# Patient Record
Sex: Male | Born: 1990 | Race: Black or African American | Hispanic: No | Marital: Single | Smoking: Never smoker
Health system: Southern US, Community
[De-identification: ages and names within clinical notes are randomized; demographics above are authoritative.]

## PROBLEM LIST (undated history)

## (undated) DIAGNOSIS — J45909 Unspecified asthma, uncomplicated: Secondary | ICD-10-CM

---

## 2016-11-11 ENCOUNTER — Ambulatory Visit (HOSPITAL_COMMUNITY)
Admission: EM | Admit: 2016-11-11 | Discharge: 2016-11-11 | Disposition: A | Discharge status: Home / Self Care | Payer: BLUE CROSS/BLUE SHIELD | Attending: Family Medicine | Admitting: Family Medicine

## 2016-11-11 ENCOUNTER — Encounter (HOSPITAL_COMMUNITY): Payer: Self-pay | Admitting: Emergency Medicine

## 2016-11-11 DIAGNOSIS — Z202 Contact with and (suspected) exposure to infections with a predominantly sexual mode of transmission: Secondary | ICD-10-CM | POA: Insufficient documentation

## 2016-11-11 HISTORY — DX: Unspecified asthma, uncomplicated: J45.909

## 2016-11-11 LAB — POCT URINALYSIS DIP (DEVICE)
BILIRUBIN URINE: NEGATIVE
Glucose, UA: NEGATIVE mg/dL
HGB URINE DIPSTICK: NEGATIVE
KETONES UR: NEGATIVE mg/dL
Nitrite: NEGATIVE
PH: 7.5 (ref 5.0–8.0)
Protein, ur: 30 mg/dL — AB
SPECIFIC GRAVITY, URINE: 1.015 (ref 1.005–1.030)
Urobilinogen, UA: 1 mg/dL (ref 0.0–1.0)

## 2016-11-11 MED ORDER — CEFTRIAXONE SODIUM 250 MG IJ SOLR
250.0000 mg | Freq: Once | INTRAMUSCULAR | Status: AC
  Administered 2016-11-11: 250 mg via INTRAMUSCULAR

## 2016-11-11 MED ORDER — AZITHROMYCIN 250 MG PO TABS
1000.0000 mg | ORAL_TABLET | Freq: Once | ORAL | Status: AC
  Administered 2016-11-11: 1000 mg via ORAL

## 2016-11-11 MED ORDER — AZITHROMYCIN 250 MG PO TABS
ORAL_TABLET | ORAL | Status: AC
  Filled 2016-11-11: qty 4

## 2016-11-11 MED ORDER — CEFTRIAXONE SODIUM 250 MG IJ SOLR
INTRAMUSCULAR | Status: AC
  Filled 2016-11-11: qty 250

## 2016-11-11 MED ORDER — LIDOCAINE HCL (PF) 1 % IJ SOLN
INTRAMUSCULAR | Status: AC
  Filled 2016-11-11: qty 2

## 2016-11-11 NOTE — ED Triage Notes (Signed)
Pt was exposed to Chlamydia.  He is not currently having any symptoms.

## 2016-11-11 NOTE — Discharge Instructions (Signed)
You're being tested today for gonorrhea, and chlamydia. Also you are also being treated as well with 1 g of azithromycin, and 250 mg of ceftriaxone. He will be notified of the results within 3-5 business days, and should any additional treatment, or therapy be needed he will be contacted and informed. I recommend returning to clinic, or at the health department in 7 days for rescreening, to insure your ear infection has cleared

## 2016-11-11 NOTE — ED Provider Notes (Signed)
CSN: [id number]     Arrival date & time 11/11/16  1632 History   First MD Initiated Contact with Patient 11/11/16 1704     Chief Complaint  Patient presents with  . Exposure to STD   (Consider location/radiation/quality/duration/timing/severity/associated sxs/prior Treatment) 26 year old male patient presents to clinic for STD screening and treatment. He reports his girlfriend has been diagnosed with chlamydia however he denies symptoms. He denies any penile discharge, denies dysuria, denies flank pain, fever, chills, abdominal pain, or other symptoms. He is sexually active, and does not use protection   The history is provided by the patient.  Exposure to STD     Past Medical History:  Diagnosis Date  . Asthma    History reviewed. No pertinent surgical history. History reviewed. No pertinent family history. Social History  Substance Use Topics  . Smoking status: Never Smoker  . Smokeless tobacco: Never Used  . Alcohol use Yes    Review of Systems  Reason unable to perform ROS: as covered in HPI.  All other systems reviewed and are negative.   Allergies  Patient has no known allergies.  Home Medications   Prior to Admission medications   Not on File   Meds Ordered and Administered this Visit   Medications  azithromycin (ZITHROMAX) tablet 1,000 mg (1,000 mg Oral Given 11/11/16 1718)  cefTRIAXone (ROCEPHIN) injection 250 mg (250 mg Intramuscular Given 11/11/16 1719)    BP 141/93 (BP Location: Left Arm)   Pulse 68   Temp 99.1 F (37.3 C) (Oral)   SpO2 99%  No data found.   Physical Exam  Constitutional: He is oriented to person, place, and time. He appears well-developed and well-nourished. No distress.  HENT:  Head: Normocephalic and atraumatic.  Abdominal: Soft. Bowel sounds are normal. He exhibits no distension. There is no tenderness. There is no guarding.  Genitourinary:  Genitourinary Comments: Deferred, urine cytology obtained.  Neurological: He is  alert and oriented to person, place, and time.  Skin: Skin is warm and dry. Capillary refill takes less than 2 seconds. He is not diaphoretic.  Psychiatric: He has a normal mood and affect.  Nursing note and vitals reviewed.   Urgent Care Course     Procedures (including critical care time)  Labs Review Labs Reviewed  POCT URINALYSIS DIP (DEVICE) - Abnormal; Notable for the following:       Result Value   Protein, ur 30 (*)    Leukocytes, UA TRACE (*)    All other components within normal limits  URINE CYTOLOGY ANCILLARY ONLY    Imaging Review No results found.      MDM   1. STD exposure    You're being tested today for gonorrhea, and chlamydia. Also you are also being treated as well with 1 g of azithromycin, and 250 mg of ceftriaxone. He will be notified of the results within 3-5 business days, and should any additional treatment, or therapy be needed he will be contacted and informed. I recommend returning to clinic, or at the health department in 7 days for rescreening, to insure your ear infection has cleared     Dorena BodoLawrence Polette Nofsinger, NP 11/11/16 1745

## 2016-11-13 LAB — URINE CYTOLOGY ANCILLARY ONLY
Chlamydia: NEGATIVE
NEISSERIA GONORRHEA: NEGATIVE

## 2016-12-12 ENCOUNTER — Encounter (HOSPITAL_COMMUNITY): Payer: Self-pay | Admitting: *Deleted

## 2016-12-12 ENCOUNTER — Ambulatory Visit (HOSPITAL_COMMUNITY)
Admission: EM | Admit: 2016-12-12 | Discharge: 2016-12-12 | Disposition: A | Discharge status: Home / Self Care | Payer: BLUE CROSS/BLUE SHIELD | Attending: Family Medicine | Admitting: Family Medicine

## 2016-12-12 DIAGNOSIS — J45901 Unspecified asthma with (acute) exacerbation: Secondary | ICD-10-CM | POA: Diagnosis not present

## 2016-12-12 MED ORDER — ALBUTEROL SULFATE (2.5 MG/3ML) 0.083% IN NEBU
2.5000 mg | INHALATION_SOLUTION | Freq: Once | RESPIRATORY_TRACT | Status: AC
  Administered 2016-12-12: 2.5 mg via RESPIRATORY_TRACT

## 2016-12-12 MED ORDER — PREDNISONE 10 MG PO TABS: 20.0000 mg | ORAL_TABLET | Freq: Every day | ORAL | 0 refills | Status: AC

## 2016-12-12 MED ORDER — IPRATROPIUM-ALBUTEROL 0.5-2.5 (3) MG/3ML IN SOLN
RESPIRATORY_TRACT | Status: AC
  Filled 2016-12-12: qty 3

## 2016-12-12 MED ORDER — ALBUTEROL SULFATE HFA 108 (90 BASE) MCG/ACT IN AERS: 1.0000 | INHALATION_SPRAY | Freq: Four times a day (QID) | RESPIRATORY_TRACT | 0 refills | Status: AC | PRN

## 2016-12-12 MED ORDER — BENZONATATE 100 MG PO CAPS: 100.0000 mg | ORAL_CAPSULE | Freq: Three times a day (TID) | ORAL | 0 refills | Status: AC

## 2016-12-12 MED ORDER — ALBUTEROL SULFATE (2.5 MG/3ML) 0.083% IN NEBU
INHALATION_SOLUTION | RESPIRATORY_TRACT | Status: AC
  Filled 2016-12-12: qty 3

## 2016-12-12 NOTE — ED Triage Notes (Signed)
Pt  Reports      Cough  And  Congestion     With   Headache      X   Several  Days        Pt    Reports  A  Headache     And    Stuffy  Nose   As   Well     Symptoms      Pt   Has  A  History of  Asthma  In past

## 2016-12-12 NOTE — ED Provider Notes (Signed)
CSN: [id number]     Arrival date & time 12/12/16  1657 History   First MD Initiated Contact with Patient 12/12/16 1719     Chief Complaint  Patient presents with  . Cough   (Consider location/radiation/quality/duration/timing/severity/associated sxs/prior Treatment) The history is provided by the patient.  Cough  Cough characteristics:  Non-productive Onset quality:  Sudden Duration:  1 day Timing:  Constant Progression:  Worsening Smoker: no (smokes Marijuana)   Relieved by:  Nothing Associated symptoms: shortness of breath (Chest tightness)   Associated symptoms: no chills, no fever and no wheezing    : 26 y/o male with H/O asthma presented to Emergency room with CC of non productive cough x 1 days, gradually worsening.Reports chest tightness and SOB with deep coughing. Unable to sleep due to persistent cough. Denies fever/chills.   Past Medical History:  Diagnosis Date  . Asthma    History reviewed. No pertinent surgical history. History reviewed. No pertinent family history. Social History  Substance Use Topics  . Smoking status: Never Smoker  . Smokeless tobacco: Never Used  . Alcohol use Yes    Review of Systems  Constitutional: Negative.  Negative for chills and fever.  HENT: Negative for congestion.   Respiratory: Positive for cough and shortness of breath (Chest tightness). Negative for wheezing.     Allergies  Patient has no known allergies.  Home Medications   Prior to Admission medications   Medication Sig Start Date End Date Taking? Authorizing Provider  albuterol (PROVENTIL HFA;VENTOLIN HFA) 108 (90 Base) MCG/ACT inhaler Inhale 1-2 puffs into the lungs every 6 (six) hours as needed for wheezing or shortness of breath. 12/12/16   Kellin Fifer, NP  benzonatate (TESSALON) 100 MG capsule Take 1 capsule (100 mg total) by mouth every 8 (eight) hours. 12/12/16   Raequon Catanzaro, NP  predniSONE (DELTASONE) 10 MG tablet Take 2 tablets (20 mg total) by mouth  daily with breakfast. 12/12/16   Simone Tuckey, NP   Meds Ordered and Administered this Visit   Medications  albuterol (PROVENTIL) (2.5 MG/3ML) 0.083% nebulizer solution 2.5 mg (2.5 mg Nebulization Given 12/12/16 1744)    BP 130/72 (BP Location: Right Arm)   Pulse 80   Temp 98.6 F (37 C) (Oral)   Resp 18   SpO2 100%  No data found.   Physical Exam  Constitutional: He is oriented to person, place, and time. He appears well-developed and well-nourished. No distress.  HENT:  Head: Normocephalic.  Eyes: Pupils are equal, round, and reactive to light.  Neck: Neck supple.  Cardiovascular: Normal rate, regular rhythm and normal heart sounds.   Pulmonary/Chest: Effort normal. No respiratory distress. He has wheezes (Bibasilar expiratory wheezing. No air flow limitation appreciated ).  Neurological: He is alert and oriented to person, place, and time.  Skin: Skin is warm.    Urgent Care Course     Procedures (including critical care time)  Labs Review Labs Reviewed - No data to display  Imaging Review No results found.   Visual Acuity Review  Right Eye Distance:   Left Eye Distance:   Bilateral Distance:    Right Eye Near:   Left Eye Near:    Bilateral Near:         MDM   1. Mild asthma with acute exacerbation, unspecified whether persistent   Use all medications as prescribed. If Sx worsens return to clinic or go to ED     Veronique Warga Nicky PughMultani, NP 12/12/16 1807

## 2016-12-12 NOTE — Discharge Instructions (Signed)
Advised to use Inhaler 1-2 puffs as needed Use all prescribed medications as directed. If Sx worsens return to clinic for re evaluation or go to ED

## 2017-05-06 ENCOUNTER — Emergency Department (HOSPITAL_COMMUNITY)
Admission: EM | Admit: 2017-05-06 | Discharge: 2017-05-06 | Disposition: A | Discharge status: Home / Self Care | Payer: BLUE CROSS/BLUE SHIELD | Attending: Emergency Medicine | Admitting: Emergency Medicine

## 2017-05-06 ENCOUNTER — Encounter (HOSPITAL_COMMUNITY): Payer: Self-pay | Admitting: *Deleted

## 2017-05-06 DIAGNOSIS — Y9389 Activity, other specified: Secondary | ICD-10-CM | POA: Insufficient documentation

## 2017-05-06 DIAGNOSIS — Y998 Other external cause status: Secondary | ICD-10-CM | POA: Insufficient documentation

## 2017-05-06 DIAGNOSIS — S61431A Puncture wound without foreign body of right hand, initial encounter: Secondary | ICD-10-CM | POA: Insufficient documentation

## 2017-05-06 DIAGNOSIS — J45909 Unspecified asthma, uncomplicated: Secondary | ICD-10-CM | POA: Insufficient documentation

## 2017-05-06 DIAGNOSIS — Y92017 Garden or yard in single-family (private) house as the place of occurrence of the external cause: Secondary | ICD-10-CM | POA: Insufficient documentation

## 2017-05-06 DIAGNOSIS — Z23 Encounter for immunization: Secondary | ICD-10-CM | POA: Insufficient documentation

## 2017-05-06 DIAGNOSIS — S61432A Puncture wound without foreign body of left hand, initial encounter: Secondary | ICD-10-CM | POA: Insufficient documentation

## 2017-05-06 DIAGNOSIS — W540XXA Bitten by dog, initial encounter: Secondary | ICD-10-CM | POA: Insufficient documentation

## 2017-05-06 DIAGNOSIS — Z79899 Other long term (current) drug therapy: Secondary | ICD-10-CM | POA: Insufficient documentation

## 2017-05-06 DIAGNOSIS — Z203 Contact with and (suspected) exposure to rabies: Secondary | ICD-10-CM | POA: Insufficient documentation

## 2017-05-06 MED ORDER — RABIES IMMUNE GLOBULIN 150 UNIT/ML IM INJ
20.0000 [IU]/kg | INJECTION | Freq: Once | INTRAMUSCULAR | Status: AC
  Administered 2017-05-06: 1125 [IU]
  Filled 2017-05-06: qty 8

## 2017-05-06 MED ORDER — TETANUS-DIPHTH-ACELL PERTUSSIS 5-2.5-18.5 LF-MCG/0.5 IM SUSP
0.5000 mL | Freq: Once | INTRAMUSCULAR | Status: AC
  Administered 2017-05-06: 0.5 mL via INTRAMUSCULAR
  Filled 2017-05-06: qty 0.5

## 2017-05-06 MED ORDER — OXYCODONE-ACETAMINOPHEN 5-325 MG PO TABS
1.0000 | ORAL_TABLET | Freq: Once | ORAL | Status: AC
  Administered 2017-05-06: 1 via ORAL
  Filled 2017-05-06: qty 1

## 2017-05-06 MED ORDER — IBUPROFEN 400 MG PO TABS
600.0000 mg | ORAL_TABLET | Freq: Once | ORAL | Status: AC
  Administered 2017-05-06: 14:00:00 600 mg via ORAL
  Filled 2017-05-06: qty 1

## 2017-05-06 MED ORDER — AMOXICILLIN-POT CLAVULANATE 875-125 MG PO TABS: 1.0000 | ORAL_TABLET | Freq: Two times a day (BID) | ORAL | 0 refills | Status: DC

## 2017-05-06 MED ORDER — AMOXICILLIN-POT CLAVULANATE 875-125 MG PO TABS
1.0000 | ORAL_TABLET | Freq: Once | ORAL | Status: AC
  Administered 2017-05-06: 1 via ORAL
  Filled 2017-05-06: qty 1

## 2017-05-06 MED ORDER — RABIES VACCINE, PCEC IM SUSR
1.0000 mL | Freq: Once | INTRAMUSCULAR | Status: AC
  Administered 2017-05-06: 1 mL via INTRAMUSCULAR
  Filled 2017-05-06: qty 1

## 2017-05-06 NOTE — ED Provider Notes (Signed)
[facility]-EMERGENCY DEPT Provider Note   CSN: [id number] Arrival date & time: 05/06/17  1132   By signing my name below, I, Douglas Lozano, attest that this documentation has been prepared under the direction and in the presence of Douglas Razor, MD. Electronically signed, Douglas Lozano, ED Scribe. 05/06/17. 1:29 PM.   History   Chief Complaint Chief Complaint  Patient presents with  . Animal Bite   The history is provided by the patient and medical records. No language interpreter was used.    Douglas Lozano is a 26 y.o. male presenting to the Emergency Department concerning puncture wounds from dog bites on bilateral hands that he sustained last night. Bleeding controlled on exam with sterile dressing. Pt describes 10/10, constant pain worse with contact and certain hand movements. Pt states an unrecognized dog got into his back yard and began fighting his dog, biting him when he attempted to break up the fight. Pt unable to locate the dog, unsure if or by whom the dog is owned and unsure of its vaccination history. Last tetanus < 10 years ago. No other complaints at this time.   Past Medical History:  Diagnosis Date  . Asthma     There are no active problems to display for this patient.   History reviewed. No pertinent surgical history.     Home Medications    Prior to Admission medications   Medication Sig Start Date End Date Taking? Authorizing Provider  albuterol (PROVENTIL HFA;VENTOLIN HFA) 108 (90 Base) MCG/ACT inhaler Inhale 1-2 puffs into the lungs every 6 (six) hours as needed for wheezing or shortness of breath. 12/12/16   Multani, Bhupinder, NP  benzonatate (TESSALON) 100 MG capsule Take 1 capsule (100 mg total) by mouth every 8 (eight) hours. 12/12/16   Multani, Bhupinder, NP  predniSONE (DELTASONE) 10 MG tablet Take 2 tablets (20 mg total) by mouth daily with breakfast. 12/12/16   Multani, Bhupinder, NP    Family History No family history on file.  Social  History Social History  Substance Use Topics  . Smoking status: Never Smoker  . Smokeless tobacco: Never Used  . Alcohol use Yes     Allergies   Patient has no known allergies.   Review of Systems Review of Systems  Constitutional: Negative for fever.  Respiratory: Negative for chest tightness and shortness of breath.   Cardiovascular: Negative for chest pain.  Gastrointestinal: Negative for nausea and vomiting.  Musculoskeletal: Negative for neck pain and neck stiffness.  Skin: Positive for wound. Negative for color change.  Neurological: Negative for weakness and numbness.  All other systems reviewed and are negative.    Physical Exam Updated Vital Signs BP (!) 136/94   Pulse 64   Temp 98.7 F (37.1 C) (Oral)   Resp 18   Wt 125 lb (56.7 kg)   SpO2 100%   Physical Exam  Constitutional: He is oriented to person, place, and time. He appears well-developed and well-nourished.  HENT:  Head: Normocephalic and atraumatic.  Eyes: EOM are normal.  Neck: Normal range of motion.  Cardiovascular: Normal rate, regular rhythm, normal heart sounds and intact distal pulses.   Pulmonary/Chest: Effort normal and breath sounds normal. No respiratory distress.  Abdominal: Soft. He exhibits no distension. There is no tenderness.  Musculoskeletal: Normal range of motion.  Neurological: He is alert and oriented to person, place, and time.  Skin: Skin is warm and dry.  2 puncture wounds over dorsal aspect of distal R wrist, 1 to palmar aspect  of R hand, 1 to fingertip of L 4th digit. No drainage or redness.  Psychiatric: He has a normal mood and affect. Judgment normal.  Nursing note and vitals reviewed.    ED Treatments / Results  DIAGNOSTIC STUDIES: Oxygen Saturation is 100% on RA, NL by my interpretation.    COORDINATION OF CARE: 1:20 PM-Discussed next steps with pt. Pt verbalized understanding and is agreeable with the plan. Will order medications.   Labs (all labs  ordered are listed, but only abnormal results are displayed) Labs Reviewed - No data to display  EKG  EKG Interpretation None       Radiology No results found.  Procedures Procedures (including critical care time)  Medications Ordered in ED Medications - No data to display   Initial Impression / Assessment and Plan / ED Course  I have reviewed the triage vital signs and the nursing notes.  Pertinent labs & imaging results that were available during my care of the patient were reviewed by me and considered in my medical decision making (see chart for details).     25yM with dog bites to hands. No over signs of infection. Local wound care. Abx. Update tetanus. PRN NSAIDs. Unknown dog. Rabies PEP.   Final Clinical Impressions(s) / ED Diagnoses   Final diagnoses:  Dog bite, initial encounter  Need for prophylactic vaccination and inoculation against rabies    New Prescriptions New Prescriptions   No medications on file    I personally preformed the services scribed in my presence. The recorded information has been reviewed is accurate. Douglas Razor, MD.    Douglas Razor, MD 05/06/17 [phone]

## 2017-05-06 NOTE — ED Notes (Signed)
Pt verbalized understanding discharge instructions and denies any further needs or questions at this time. VS stable, ambulatory and steady gait.   

## 2017-05-06 NOTE — ED Triage Notes (Addendum)
Pt was bitten by an unknown dog in [place] (dog came in his back yard last pm). Pt has a puncture to his right hand, right wrist and left ring finger.  Pt does not know when his last tetanus shot was and he does not know the dog that bit him (and thus not the vaccination status of that dog).

## 2017-05-06 NOTE — Discharge Instructions (Signed)
The CDC recommends rabies post exposure prophylaxis (vaccine) on days 1, 3, 7 and 14. You're getting the first dose today. You need repeat vaccination on 8/21, 8/26 and 05/20/17.   You can go to the [facility] during the day to get these. The wait is generally shorter. Return to the ER if you have to. Take antibiotics until finished.  Clean wounds with mild soap and warm water. Take ibuprofen 600mg  every 6 hours as needed for pain.

## 2017-05-09 ENCOUNTER — Emergency Department (HOSPITAL_COMMUNITY)
Admission: EM | Admit: 2017-05-09 | Discharge: 2017-05-10 | Disposition: A | Discharge status: Home / Self Care | Payer: BLUE CROSS/BLUE SHIELD | Attending: Emergency Medicine | Admitting: Emergency Medicine

## 2017-05-09 ENCOUNTER — Encounter (HOSPITAL_COMMUNITY): Payer: Self-pay

## 2017-05-09 DIAGNOSIS — Z23 Encounter for immunization: Secondary | ICD-10-CM | POA: Insufficient documentation

## 2017-05-09 DIAGNOSIS — W540XXD Bitten by dog, subsequent encounter: Secondary | ICD-10-CM | POA: Insufficient documentation

## 2017-05-09 DIAGNOSIS — Z79899 Other long term (current) drug therapy: Secondary | ICD-10-CM | POA: Insufficient documentation

## 2017-05-09 DIAGNOSIS — J45909 Unspecified asthma, uncomplicated: Secondary | ICD-10-CM | POA: Insufficient documentation

## 2017-05-09 MED ORDER — RABIES VACCINE, PCEC IM SUSR
1.0000 mL | Freq: Once | INTRAMUSCULAR | Status: AC
  Administered 2017-05-09: 1 mL via INTRAMUSCULAR
  Filled 2017-05-09: qty 1

## 2017-05-09 NOTE — ED Notes (Signed)
Waiting for pharmacy to send medication

## 2017-05-09 NOTE — ED Provider Notes (Signed)
[facility] Provider Note   CSN: [id number] Arrival date & time: 05/09/17  2111     History   Chief Complaint Chief Complaint  Patient presents with  . Rabies Injection    HPI Douglas Lozano is a 26 y.o. male who presents to ED for second dose of rabies vaccine after dog bite sustained on 05/06/17.  Unknown dog bit right distal forearm/wrist and left fourth finger on 8/19. First dose of rabies vaccine on 8/19. Was prescribed augmentin but states he has not refilled it because he doesn't get paid until 05/11/17 and has no other way to pay for medication. Has been using water, soap and peroxide to keep clean. Denies pain, swelling, redness, drainage, fevers, pain with movement.   HPI  Past Medical History:  Diagnosis Date  . Asthma     There are no active problems to display for this patient.   History reviewed. No pertinent surgical history.     Home Medications    Prior to Admission medications   Medication Sig Start Date End Date Taking? Authorizing Provider  albuterol (PROVENTIL HFA;VENTOLIN HFA) 108 (90 Base) MCG/ACT inhaler Inhale 1-2 puffs into the lungs every 6 (six) hours as needed for wheezing or shortness of breath. 12/12/16   Multani, Bhupinder, NP  amoxicillin-clavulanate (AUGMENTIN) 875-125 MG tablet Take 1 tablet by mouth 2 (two) times daily. 05/06/17   Raeford Razor, MD  benzonatate (TESSALON) 100 MG capsule Take 1 capsule (100 mg total) by mouth every 8 (eight) hours. 12/12/16   Multani, Bhupinder, NP  predniSONE (DELTASONE) 10 MG tablet Take 2 tablets (20 mg total) by mouth daily with breakfast. 12/12/16   Multani, Bhupinder, NP    Family History No family history on file.  Social History Social History  Substance Use Topics  . Smoking status: Never Smoker  . Smokeless tobacco: Never Used  . Alcohol use Yes     Allergies   Patient has no known allergies.   Review of Systems Review of Systems  Constitutional: Negative for fever.    Musculoskeletal: Negative for arthralgias.  Skin: Positive for wound. Negative for color change.  Allergic/Immunologic: Negative for immunocompromised state.     Physical Exam Updated Vital Signs BP (!) 145/93 (BP Location: Left Arm)   Pulse 63   Temp 98.2 F (36.8 C) (Oral)   Resp 16   Wt 56.7 kg (125 lb)   SpO2 100%   Physical Exam  Constitutional: He is oriented to person, place, and time. He appears well-developed and well-nourished. No distress.  NAD.  HENT:  Head: Normocephalic and atraumatic.  Right Ear: External ear normal.  Left Ear: External ear normal.  Nose: Nose normal.  Eyes: Conjunctivae and EOM are normal. No scleral icterus.  Neck: Normal range of motion. Neck supple.  Cardiovascular: Normal rate, regular rhythm, normal heart sounds and intact distal pulses.   No murmur heard. <2 cap refill to bilateral digit tips   Pulmonary/Chest: Effort normal and breath sounds normal. He has no wheezes.  Musculoskeletal: Normal range of motion. He exhibits no deformity.  Full painless AROM of bilateral wrists and digits  Full thumb opposition bilaterally   Neurological: He is alert and oriented to person, place, and time.  Skin: Skin is warm and dry. Capillary refill takes less than 2 seconds.  1 puncture wound to distal dorsal forearm 1 puncture wound to hypothenar prominence  Superficial abrasion to left 4th digit finger pad  Psychiatric: He has a normal mood and affect. His behavior  is normal. Judgment and thought content normal.  Nursing note and vitals reviewed.    ED Treatments / Results  Labs (all labs ordered are listed, but only abnormal results are displayed) Labs Reviewed - No data to display  EKG  EKG Interpretation None       Radiology No results found.  Procedures Procedures (including critical care time)  Medications Ordered in ED Medications  rabies vaccine (RABAVERT) injection 1 mL (1 mL Intramuscular Given 05/09/17 2352)      Initial Impression / Assessment and Plan / ED Course  I have reviewed the triage vital signs and the nursing notes.  Pertinent labs & imaging results that were available during my care of the patient were reviewed by me and considered in my medical decision making (see chart for details).     Second dose of rabies vaccine given today. Wounds are healing well without signs of infection. Extremity is NVI with intact, painless AROM. Pt has not filled rx for augmentin as he does not get paid until tomorrow. Stressed importance of antibiotics, although wounds are small and appear to be healing without signs of infection. He verbalized understanding and is agreeable with plan, states he will get abx tomorrow. He is aware of s/s that would warrant return to ED for re-evaluation.   rabies vaccine schedule: Marland Kitchen Dose #1 on 05/06/17 completed . Dose #2 on 05/09/17 completed  . Dose #3 on 05/13/17 . Dose #4 on 05/20/17   Final Clinical Impressions(s) / ED Diagnoses   Final diagnoses:  Need for rabies vaccination  Dog bite, subsequent encounter    New Prescriptions Discharge Medication List as of 05/10/2017 12:05 AM       Liberty Handy, PA-C 05/10/17 0165    Mancel Bale, MD 05/10/17 [phone]

## 2017-05-09 NOTE — ED Triage Notes (Signed)
Pt reports he received first round of rabies vaccines 8/19 after dog bite and is here for second injection

## 2017-05-10 NOTE — Discharge Instructions (Signed)
You received dose #2 of rabies vaccine today. You need a total of 4 to complete post exposure prophylaxis against rabies.   Here is your rabies vaccine schedule: Dose #1 on 05/06/17 completed Dose #2 on 05/09/17 completed  Dose #3 on 05/13/17 Dose #4 on 05/20/17   Monitor for signs of infection including's swelling, redness, discharge, increased pain, fevers  Please refill your antibiotics as soon as possible and start taking them.

## 2017-05-13 ENCOUNTER — Encounter (HOSPITAL_COMMUNITY): Payer: Self-pay

## 2017-05-13 ENCOUNTER — Emergency Department (HOSPITAL_COMMUNITY)
Admission: EM | Admit: 2017-05-13 | Discharge: 2017-05-13 | Disposition: A | Discharge status: Home / Self Care | Payer: BLUE CROSS/BLUE SHIELD | Attending: Emergency Medicine | Admitting: Emergency Medicine

## 2017-05-13 DIAGNOSIS — W540XXA Bitten by dog, initial encounter: Secondary | ICD-10-CM | POA: Insufficient documentation

## 2017-05-13 DIAGNOSIS — S4981XD Other specified injuries of right shoulder and upper arm, subsequent encounter: Secondary | ICD-10-CM | POA: Insufficient documentation

## 2017-05-13 DIAGNOSIS — Z23 Encounter for immunization: Secondary | ICD-10-CM | POA: Insufficient documentation

## 2017-05-13 DIAGNOSIS — Y929 Unspecified place or not applicable: Secondary | ICD-10-CM | POA: Insufficient documentation

## 2017-05-13 DIAGNOSIS — Y9389 Activity, other specified: Secondary | ICD-10-CM | POA: Insufficient documentation

## 2017-05-13 DIAGNOSIS — Y999 Unspecified external cause status: Secondary | ICD-10-CM | POA: Insufficient documentation

## 2017-05-13 DIAGNOSIS — Z79899 Other long term (current) drug therapy: Secondary | ICD-10-CM | POA: Insufficient documentation

## 2017-05-13 DIAGNOSIS — J45909 Unspecified asthma, uncomplicated: Secondary | ICD-10-CM | POA: Insufficient documentation

## 2017-05-13 MED ORDER — AMOXICILLIN-POT CLAVULANATE 875-125 MG PO TABS: 1.0000 | ORAL_TABLET | Freq: Two times a day (BID) | ORAL | 0 refills | Status: AC

## 2017-05-13 MED ORDER — RABIES VACCINE, PCEC IM SUSR
1.0000 mL | Freq: Once | INTRAMUSCULAR | Status: AC
  Administered 2017-05-13: 1 mL via INTRAMUSCULAR
  Filled 2017-05-13: qty 1

## 2017-05-13 NOTE — ED Provider Notes (Signed)
[facility]-EMERGENCY DEPT Provider Note   CSN: [id number] Arrival date & time: 05/13/17  1902     History   Chief Complaint Chief Complaint  Patient presents with  . Rabies Injection    HPI Douglas Lozano is a 26 y.o. male presenting for third round of rabies vaccine.  Patient was bit by an unknown dog on 05/06/2017. He sustained injury to the right distal forearm, right wrist, and left fourth finger. First is rabies vaccine was given on 8/19. He was prescribed Augmentin, but he was not able to fill it due to not having the money. Patient presented again on 8/22 for second round of rabies vaccine. He was having no pain, redness, swelling, or fevers or chills at the time. He presents today for his third round of rabies vaccine. He states he lost his rx for augmentin, and he now has the money to fill it and would like a new script. He denies fevers, chill, CP, SOB, nausea, vomiting, abd pain. He denies streaking of the skin. He denies drainage form any lesion. He has minimal surrounding tenderness of the lacs. He denies numbness or tingling.   HPI  Past Medical History:  Diagnosis Date  . Asthma     There are no active problems to display for this patient.   History reviewed. No pertinent surgical history.     Home Medications    Prior to Admission medications   Medication Sig Start Date End Date Taking? Authorizing Provider  albuterol (PROVENTIL HFA;VENTOLIN HFA) 108 (90 Base) MCG/ACT inhaler Inhale 1-2 puffs into the lungs every 6 (six) hours as needed for wheezing or shortness of breath. 12/12/16   Multani, Bhupinder, NP  amoxicillin-clavulanate (AUGMENTIN) 875-125 MG tablet Take 1 tablet by mouth 2 (two) times daily. 05/13/17   Kindred Heying, PA-C  benzonatate (TESSALON) 100 MG capsule Take 1 capsule (100 mg total) by mouth every 8 (eight) hours. 12/12/16   Multani, Bhupinder, NP  predniSONE (DELTASONE) 10 MG tablet Take 2 tablets (20 mg total) by mouth daily with breakfast.  12/12/16   Multani, Bhupinder, NP    Family History History reviewed. No pertinent family history.  Social History Social History  Substance Use Topics  . Smoking status: Never Smoker  . Smokeless tobacco: Never Used  . Alcohol use Yes     Allergies   Patient has no known allergies.   Review of Systems Review of Systems  Constitutional: Negative for chills and fever.  Gastrointestinal: Negative for nausea and vomiting.  Skin: Positive for wound.  Neurological: Negative for numbness.     Physical Exam Updated Vital Signs BP 137/76 (BP Location: Right Arm)   Pulse 80   Temp 98.2 F (36.8 C) (Oral)   Resp 16   SpO2 99%   Physical Exam  Constitutional: He is oriented to person, place, and time. He appears well-developed and well-nourished. No distress.  HENT:  Head: Normocephalic and atraumatic.  Eyes: EOM are normal.  Neck: Normal range of motion.  Pulmonary/Chest: Effort normal.  Abdominal: He exhibits no distension.  Musculoskeletal: Normal range of motion.  Neurological: He is alert and oriented to person, place, and time.  Skin: Skin is warm. No rash noted.     Well healing lac to distal L 4th digit. Sensation intact. No surrounding erythema. Strength against resistance intact.  Well healing lac to R wrist and R forearm. No surrounding erythema. No drainage. Minimal TTP directly around wound on forearm. (see picture for location of lacs) Radial pulses intact  bilaterally. Full active ROM of wrists and fingers bilaterally without pain. Sensation intact bilaterally. Soft compartments.   Psychiatric: He has a normal mood and affect.  Nursing note and vitals reviewed.    ED Treatments / Results  Labs (all labs ordered are listed, but only abnormal results are displayed) Labs Reviewed - No data to display  EKG  EKG Interpretation None       Radiology No results found.  Procedures Procedures (including critical care time)  Medications Ordered in  ED Medications  rabies vaccine (RABAVERT) injection 1 mL (1 mL Intramuscular Given 05/13/17 2041)     Initial Impression / Assessment and Plan / ED Course  I have reviewed the triage vital signs and the nursing notes.  Pertinent labs & imaging results that were available during my care of the patient were reviewed by me and considered in my medical decision making (see chart for details).     Pt presenting for 3rd round of rabies vaccine. Physical exam shows well healing lacerations without clear sign of infection. Discussed that abx at this point are likely not necessary, but as pt has minimal TTP of the forearm lac, pt states he would like to take the abx. No signs of systemic infection, VSS. Pt to return in 1 wk for last rabies vaccine. Discussed plan with pt. Return precautions given. Pt states he understands and agrees to plan.   Rabies vaccine schedule: Dose 1: 08/19  completed Dose 2: 08/22  completed Dose 3: 08/26  completed Dose 4: 09/02  completed   Final Clinical Impressions(s) / ED Diagnoses   Final diagnoses:  Need for rabies vaccination    New Prescriptions Discharge Medication List as of 05/13/2017  8:05 PM       Alveria Apley, PA-C 05/14/17 1408    Arby Barrette, MD 05/14/17 1531

## 2017-05-13 NOTE — Discharge Instructions (Signed)
Take antibiotics as prescribed. Return in one week for the last rabies shot. Return to the emergency department sooner if you develop fever, chills, worsening pain, numbness, tingling, or any new or worsening symptoms.

## 2017-05-13 NOTE — ED Triage Notes (Signed)
Pt here for rabies vaccines.  Pt lost prescription for antibiotics.

## 2017-05-20 ENCOUNTER — Emergency Department (HOSPITAL_COMMUNITY)
Admission: EM | Admit: 2017-05-20 | Discharge: 2017-05-20 | Disposition: A | Discharge status: Home / Self Care | Payer: BLUE CROSS/BLUE SHIELD | Attending: Emergency Medicine | Admitting: Emergency Medicine

## 2017-05-20 ENCOUNTER — Encounter (HOSPITAL_COMMUNITY): Payer: Self-pay

## 2017-05-20 DIAGNOSIS — S6981XD Other specified injuries of right wrist, hand and finger(s), subsequent encounter: Secondary | ICD-10-CM | POA: Insufficient documentation

## 2017-05-20 DIAGNOSIS — J45909 Unspecified asthma, uncomplicated: Secondary | ICD-10-CM | POA: Insufficient documentation

## 2017-05-20 DIAGNOSIS — Z23 Encounter for immunization: Secondary | ICD-10-CM | POA: Insufficient documentation

## 2017-05-20 DIAGNOSIS — Y9389 Activity, other specified: Secondary | ICD-10-CM | POA: Insufficient documentation

## 2017-05-20 DIAGNOSIS — W540XXA Bitten by dog, initial encounter: Secondary | ICD-10-CM | POA: Insufficient documentation

## 2017-05-20 DIAGNOSIS — Z79899 Other long term (current) drug therapy: Secondary | ICD-10-CM | POA: Insufficient documentation

## 2017-05-20 DIAGNOSIS — Z203 Contact with and (suspected) exposure to rabies: Secondary | ICD-10-CM | POA: Insufficient documentation

## 2017-05-20 DIAGNOSIS — Y929 Unspecified place or not applicable: Secondary | ICD-10-CM | POA: Insufficient documentation

## 2017-05-20 DIAGNOSIS — Y999 Unspecified external cause status: Secondary | ICD-10-CM | POA: Insufficient documentation

## 2017-05-20 MED ORDER — RABIES VACCINE, PCEC IM SUSR
1.0000 mL | Freq: Once | INTRAMUSCULAR | Status: AC
  Administered 2017-05-20: 1 mL via INTRAMUSCULAR
  Filled 2017-05-20: qty 1

## 2017-05-20 NOTE — ED Provider Notes (Signed)
[facility] Provider Note   CSN: [id number] Arrival date & time: 05/20/17  1740     History   Chief Complaint Chief Complaint  Patient presents with  . rabies vaccine    HPI Douglas Lozano is a 26 y.o. male who presents to the emergency department for fourth round of rabies vaccine.  Patient was bit by a dog on 05/06/2017 while trying to break up encounter between his dog and an unknown dog. He is unsure which dog bit him. He sustained 2 puncture wounds over the dorsal aspect of the distal right wrist, one puncture wound to the palmar aspect of the right hand and one to the fingertip of the left fourth digit. He was prescribed Augmentin however never filled this prescription or took it. He's had no pain, redness, swelling at the sites of injury and denies fever or chills at home. No other complaints at this time  HPI  Past Medical History:  Diagnosis Date  . Asthma     There are no active problems to display for this patient.   History reviewed. No pertinent surgical history.     Home Medications    Prior to Admission medications   Medication Sig Start Date End Date Taking? Authorizing Provider  albuterol (PROVENTIL HFA;VENTOLIN HFA) 108 (90 Base) MCG/ACT inhaler Inhale 1-2 puffs into the lungs every 6 (six) hours as needed for wheezing or shortness of breath. 12/12/16   Multani, Bhupinder, NP  amoxicillin-clavulanate (AUGMENTIN) 875-125 MG tablet Take 1 tablet by mouth 2 (two) times daily. 05/13/17   Caccavale, Sophia, PA-C  benzonatate (TESSALON) 100 MG capsule Take 1 capsule (100 mg total) by mouth every 8 (eight) hours. 12/12/16   Multani, Bhupinder, NP  predniSONE (DELTASONE) 10 MG tablet Take 2 tablets (20 mg total) by mouth daily with breakfast. 12/12/16   Multani, Bhupinder, NP    Family History No family history on file.  Social History Social History  Substance Use Topics  . Smoking status: Never Smoker  . Smokeless tobacco: Never Used  . Alcohol use Yes      Allergies   Patient has no known allergies.   Review of Systems Review of Systems  Constitutional: Negative for chills and fever.  Respiratory: Negative for shortness of breath.   Cardiovascular: Negative for chest pain.  Gastrointestinal: Negative for abdominal pain, diarrhea, nausea and vomiting.  Musculoskeletal: Negative for arthralgias and joint swelling.  Skin: Positive for wound (healing appropriately).  Neurological: Negative for weakness and numbness.     Physical Exam Updated Vital Signs BP (!) 135/98   Pulse 76   Temp 98.7 F (37.1 C) (Oral)   Resp 16   SpO2 99%   Physical Exam  Constitutional: He appears well-developed and well-nourished.  HENT:  Head: Normocephalic and atraumatic.  Right Ear: External ear normal.  Left Ear: External ear normal.  Eyes: Conjunctivae are normal. Right eye exhibits no discharge. Left eye exhibits no discharge. No scleral icterus.  Cardiovascular:  Pulses:      Radial pulses are 2+ on the right side, and 2+ on the left side.  Pulmonary/Chest: Effort normal. No respiratory distress.  Musculoskeletal:  Healing/near healed wound at the right proximal palmar hand. No surrounding redness, tenderness, swelling, heat. Full range of motion of all digits of right hand to flexion, extension, abduction, adduction. Able strength against resistance of all digits. Sensory intact to light touch distally. Healing/near healed wound on the dorsal aspect of distal right wrist. No surrounding redness, tenderness, swelling, heat.  Full active range of motion of the wrist to flexion, extension, ulnar deviation and radial deviation. Strength 5/5. Sensation intact to light touch distally. Healing/near healed wound on the palmar aspect of distal tip of fourth digit of left hand. No surrounding redness or tenderness, swelling, heat.Full range of motion of all digits of right hand to flexion, extension, abduction, adduction. Able strength against  resistance of all digits. Sensory intact to light touch distally.  Neurological: He is alert. No sensory deficit.  Skin: No pallor.  Psychiatric: He has a normal mood and affect.  Nursing note and vitals reviewed.    ED Treatments / Results  Labs (all labs ordered are listed, but only abnormal results are displayed) Labs Reviewed - No data to display  EKG  EKG Interpretation None       Radiology No results found.  Procedures Procedures (including critical care time)  Medications Ordered in ED Medications  rabies vaccine (RABAVERT) injection 1 mL (not administered)     Initial Impression / Assessment and Plan / ED Course  I have reviewed the triage vital signs and the nursing notes.  Pertinent labs & imaging results that were available during my care of the patient were reviewed by me and considered in my medical decision making (see chart for details).     Patient presenting for fourth round of rabies vaccine. Physical exam shows healing/near healed lacerations without any signs of infection. Patient did not fill or take antibiotics but do not feel that the immediate this time. Patient is afebrile on presentation does not have signs of systemic infection. Patient received fourth dose of rabies vaccine.   Rabies vaccine schedule Dose 1: 8/19 completed Dose 2: 08/22 completed Dose 3: 8/26 completed Dose 4: 9/02 completed today.   As patient does not have a past medical history that would make him immune compromised he does not require a fifth dose of rabies vaccine. Discussed this with patient. Return precautions given and discussed. All questions answered. Patient appears stable for discharge.   Final Clinical Impressions(s) / ED Diagnoses   Final diagnoses:  Need for rabies vaccination    New Prescriptions New Prescriptions   No medications on file     Princella Pellegrini 05/20/17 Alisia Ferrari, MD 05/20/17 [phone]

## 2017-05-20 NOTE — ED Triage Notes (Signed)
Patient here to have rabies vaccine as scheduled

## 2017-05-20 NOTE — ED Notes (Signed)
Patient Alert and oriented X4. Stable and ambulatory. Patient verbalized understanding of the discharge instructions.  Patient belongings were taken by the patient.  

## 2017-05-20 NOTE — Discharge Instructions (Signed)
This completes your course of rabies vaccines. Please establish care with a primary caregiver using the phone number provided in this handout or by following up at the wellness Center. The wellness Center see patients without insurance. I have listed the warning signs below for your benefit.  What are the signs or symptoms? Symptoms of this condition usually start 1-3 months after you are bitten. As you have received the vaccine this is highly unlikely.  Common symptoms include: Headache. Fever. Fatigue and weakness. Agitation. Anxiety. Confusion. Unusual behavior, such as hyperactivity, fear of water (hydrophobia), or fear of air (aerophobia). Hallucinations. Insomnia. Weakness in the arms or legs. Difficulty swallowing.  If any of your bite wounds develop any of the following please get help right away if: You have a red streak going away from your wound. You have fluid, blood, or pus coming from your wound. You have a fever or chills. You have trouble moving your injured area. You have numbness or tingling anywhere on your body.

## 2017-05-20 NOTE — ED Notes (Signed)
Awaiting meds from pharmacy, request sent

## 2017-08-12 ENCOUNTER — Emergency Department (HOSPITAL_COMMUNITY): Payer: No Typology Code available for payment source

## 2017-08-12 ENCOUNTER — Emergency Department (HOSPITAL_COMMUNITY)
Admission: EM | Admit: 2017-08-12 | Discharge: 2017-08-13 | Disposition: A | Discharge status: Home / Self Care | Payer: No Typology Code available for payment source | Attending: Emergency Medicine | Admitting: Emergency Medicine

## 2017-08-12 DIAGNOSIS — M549 Dorsalgia, unspecified: Secondary | ICD-10-CM | POA: Diagnosis not present

## 2017-08-12 DIAGNOSIS — M542 Cervicalgia: Secondary | ICD-10-CM | POA: Diagnosis not present

## 2017-08-12 DIAGNOSIS — Z79899 Other long term (current) drug therapy: Secondary | ICD-10-CM | POA: Diagnosis not present

## 2017-08-12 DIAGNOSIS — J45909 Unspecified asthma, uncomplicated: Secondary | ICD-10-CM | POA: Insufficient documentation

## 2017-08-12 DIAGNOSIS — M25572 Pain in left ankle and joints of left foot: Secondary | ICD-10-CM | POA: Diagnosis present

## 2017-08-12 MED ORDER — MORPHINE SULFATE (PF) 4 MG/ML IV SOLN
4.0000 mg | Freq: Once | INTRAVENOUS | Status: AC
  Administered 2017-08-12: 4 mg via INTRAVENOUS
  Filled 2017-08-12: qty 1

## 2017-08-12 NOTE — ED Notes (Signed)
Patient transported to CT 
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
## 2017-08-13 MED ORDER — METHOCARBAMOL 500 MG PO TABS: 500.0000 mg | ORAL_TABLET | Freq: Two times a day (BID) | ORAL | 0 refills | Status: AC

## 2017-08-13 MED ORDER — NAPROXEN 500 MG PO TABS: 500.0000 mg | ORAL_TABLET | Freq: Two times a day (BID) | ORAL | 0 refills | Status: AC

## 2017-08-13 NOTE — Discharge Instructions (Signed)
Take the prescribed medication as directed.  This can help with your soreness/stiffness.  Can also use heat therapy and/or ice to help as well. Can use crutches for now, would try to start progressing back to full weight bearing as tolerated. Follow-up with your primary care doctor. If ankle or other joints continues giving you issues, would try to follow-up with orthopedics.  Can call for appt. Return to the ED for new or worsening symptoms.

## 2017-12-28 ENCOUNTER — Emergency Department (HOSPITAL_COMMUNITY)
Admission: EM | Admit: 2017-12-28 | Discharge: 2017-12-28 | Disposition: A | Discharge status: Home / Self Care | Payer: Self-pay | Attending: Emergency Medicine | Admitting: Emergency Medicine

## 2017-12-28 ENCOUNTER — Other Ambulatory Visit: Payer: Self-pay

## 2017-12-28 ENCOUNTER — Encounter (HOSPITAL_COMMUNITY): Payer: Self-pay | Admitting: Emergency Medicine

## 2017-12-28 DIAGNOSIS — Z202 Contact with and (suspected) exposure to infections with a predominantly sexual mode of transmission: Secondary | ICD-10-CM | POA: Insufficient documentation

## 2017-12-28 DIAGNOSIS — R300 Dysuria: Secondary | ICD-10-CM | POA: Insufficient documentation

## 2017-12-28 DIAGNOSIS — Z79899 Other long term (current) drug therapy: Secondary | ICD-10-CM | POA: Insufficient documentation

## 2017-12-28 DIAGNOSIS — J45909 Unspecified asthma, uncomplicated: Secondary | ICD-10-CM | POA: Insufficient documentation

## 2017-12-28 LAB — URINALYSIS, ROUTINE W REFLEX MICROSCOPIC
Bilirubin Urine: NEGATIVE
GLUCOSE, UA: NEGATIVE mg/dL
Hgb urine dipstick: NEGATIVE
Ketones, ur: NEGATIVE mg/dL
Nitrite: NEGATIVE
PH: 8 (ref 5.0–8.0)
Protein, ur: NEGATIVE mg/dL
Specific Gravity, Urine: 1.003 — ABNORMAL LOW (ref 1.005–1.030)

## 2017-12-28 MED ORDER — STERILE WATER FOR INJECTION IJ SOLN
INTRAMUSCULAR | Status: AC
  Filled 2017-12-28: qty 10

## 2017-12-28 MED ORDER — CEFTRIAXONE SODIUM 250 MG IJ SOLR
250.0000 mg | Freq: Once | INTRAMUSCULAR | Status: AC
  Administered 2017-12-28: 250 mg via INTRAMUSCULAR
  Filled 2017-12-28: qty 250

## 2017-12-28 MED ORDER — AZITHROMYCIN 250 MG PO TABS
1000.0000 mg | ORAL_TABLET | Freq: Once | ORAL | Status: AC
  Administered 2017-12-28: 1000 mg via ORAL
  Filled 2017-12-28: qty 4

## 2017-12-28 NOTE — ED Triage Notes (Signed)
Patient states he recently was told that his sexual partner for x2 months tested positive for chlamydia and he wants to get checked out. States sometimes he notices a mild burning with urination but denies any other symptoms.

## 2017-12-28 NOTE — ED Provider Notes (Signed)
[facility] EMERGENCY DEPARTMENT Provider Note   CSN: [id number] Arrival date & time: 12/28/17  1310     History   Chief Complaint Chief Complaint  Patient presents with  . Exposure to STD    HPI Anant Couper is a 27 y.o. male presenting to the ED for positive STD exposure.  Patient states his sexual partner is here in the ED, being treated for positive chlamydia result.  He states he has been having unprotected sexual intercourse with her for about 2 months.  Reports some associated dysuria, however denies penile discharge, testicular pain or swelling, pain with defecation, abdominal pain, fever, or any other complaints.  The history is provided by the patient.    Past Medical History:  Diagnosis Date  . Asthma     There are no active problems to display for this patient.   History reviewed. No pertinent surgical history.      Home Medications    Prior to Admission medications   Medication Sig Start Date End Date Taking? Authorizing Provider  albuterol (PROVENTIL HFA;VENTOLIN HFA) 108 (90 Base) MCG/ACT inhaler Inhale 1-2 puffs into the lungs every 6 (six) hours as needed for wheezing or shortness of breath. 12/12/16   Multani, Bhupinder, NP  amoxicillin-clavulanate (AUGMENTIN) 875-125 MG tablet Take 1 tablet by mouth 2 (two) times daily. 05/13/17   Caccavale, Sophia, PA-C  benzonatate (TESSALON) 100 MG capsule Take 1 capsule (100 mg total) by mouth every 8 (eight) hours. 12/12/16   Multani, Bhupinder, NP  methocarbamol (ROBAXIN) 500 MG tablet Take 1 tablet (500 mg total) by mouth 2 (two) times daily. 08/13/17   Garlon Hatchet, PA-C  naproxen (NAPROSYN) 500 MG tablet Take 1 tablet (500 mg total) by mouth 2 (two) times daily with a meal. 08/13/17   Garlon Hatchet, PA-C  predniSONE (DELTASONE) 10 MG tablet Take 2 tablets (20 mg total) by mouth daily with breakfast. 12/12/16   Multani, Bhupinder, NP    Family History No family history on file.  Social  History Social History   Tobacco Use  . Smoking status: Never Smoker  . Smokeless tobacco: Never Used  Substance Use Topics  . Alcohol use: Yes    Comment: occasional  . Drug use: Yes    Types: Marijuana     Allergies   Patient has no known allergies.   Review of Systems Review of Systems  Constitutional: Negative for fever.  Gastrointestinal: Negative for abdominal pain.  Genitourinary: Positive for dysuria. Negative for discharge, penile pain, penile swelling, scrotal swelling and testicular pain.     Physical Exam Updated Vital Signs BP (!) 154/104 (BP Location: Right Arm)   Pulse (!) 58   Temp (!) 97.5 F (36.4 C) (Oral)   Resp 16   SpO2 100%   Physical Exam  Constitutional: He appears well-developed and well-nourished. No distress.  HENT:  Head: Normocephalic and atraumatic.  Eyes: Conjunctivae are normal.  Pulmonary/Chest: Effort normal.  Abdominal: Soft. Bowel sounds are normal. He exhibits no distension. There is no tenderness.  Genitourinary: Testes normal. Right testis shows no mass, no swelling and no tenderness. Left testis shows no mass, no swelling and no tenderness. Circumcised. No penile erythema or penile tenderness. No discharge found.  Genitourinary Comments: Exam performed with male EMT chaperone present.  Psychiatric: He has a normal mood and affect. His behavior is normal.  Nursing note and vitals reviewed.    ED Treatments / Results  Labs (all labs ordered are listed, but  only abnormal results are displayed) Labs Reviewed  URINALYSIS, ROUTINE W REFLEX MICROSCOPIC  RPR  HIV ANTIBODY (ROUTINE TESTING)  GC/CHLAMYDIA PROBE AMP ([place]) NOT AT [facility]    EKG None  Radiology No results found.  Procedures Procedures (including critical care time)  Medications Ordered in ED Medications  sterile water (preservative free) injection (has no administration in time range)  cefTRIAXone (ROCEPHIN) injection 250 mg (250 mg Intramuscular  Given 12/28/17 1641)  azithromycin (ZITHROMAX) tablet 1,000 mg (1,000 mg Oral Given 12/28/17 1641)     Initial Impression / Assessment and Plan / ED Course  I have reviewed the triage vital signs and the nursing notes.  Pertinent labs & imaging results that were available during my care of the patient were reviewed by me and considered in my medical decision making (see chart for details).    Patient is afebrile without abdominal tenderness, abdominal pain or painful bowel movements to indicate prostatitis.  No tenderness to palpation of the testes or epididymis to suggest orchitis or epididymitis.  STD cultures obtained including HIV, syphilis, gonorrhea and chlamydia. Patient treated prophylactically with azithromycin and Rocephin, knowing his positive exposure to chlamydia. Patient to be discharged with instructions to follow up with PCP. Discussed importance of using protection when sexually active. Pt understands that they have GC/Chlamydia cultures, HIV and syphillis cultures pending and that they will need to inform all sexual partners if results return positive.   Discussed results, findings, treatment and follow up. Patient advised of return precautions. Patient verbalized understanding and agreed with plan.  Final Clinical Impressions(s) / ED Diagnoses   Final diagnoses:  STD exposure    ED Discharge Orders    None       Robinson, [place] N, PA-C 12/28/17 1645    Shaune PollackIsaacs, Cameron, MD 12/29/17 1108

## 2017-12-28 NOTE — Discharge Instructions (Addendum)
Please read the instructions below.  Talk with your primary care provider about any new medications.  You have been treated today for gonorrhea and chlamydia. You will receive a call from the hospital if your test results come back positive. Avoid sexual activity until you know your test results. If your results come back positive, it is important that you inform all of your sexual partners.  Return to the ER for new or worsening symptoms.   

## 2017-12-29 LAB — RPR: RPR Ser Ql: NONREACTIVE

## 2017-12-29 LAB — HIV ANTIBODY (ROUTINE TESTING W REFLEX): HIV Screen 4th Generation wRfx: NONREACTIVE

## 2017-12-31 LAB — GC/CHLAMYDIA PROBE AMP (~~LOC~~) NOT AT ARMC
Chlamydia: POSITIVE — AB
NEISSERIA GONORRHEA: NEGATIVE

## 2018-05-15 ENCOUNTER — Encounter (HOSPITAL_COMMUNITY): Payer: Self-pay

## 2018-05-15 ENCOUNTER — Emergency Department (HOSPITAL_COMMUNITY)
Admission: EM | Admit: 2018-05-15 | Discharge: 2018-05-15 | Disposition: A | Discharge status: Home / Self Care | Payer: Self-pay | Attending: Emergency Medicine | Admitting: Emergency Medicine

## 2018-05-15 DIAGNOSIS — J45909 Unspecified asthma, uncomplicated: Secondary | ICD-10-CM | POA: Insufficient documentation

## 2018-05-15 DIAGNOSIS — Z79899 Other long term (current) drug therapy: Secondary | ICD-10-CM | POA: Insufficient documentation

## 2018-05-15 DIAGNOSIS — Z202 Contact with and (suspected) exposure to infections with a predominantly sexual mode of transmission: Secondary | ICD-10-CM | POA: Insufficient documentation

## 2018-05-15 LAB — URINALYSIS, ROUTINE W REFLEX MICROSCOPIC
Bilirubin Urine: NEGATIVE
Glucose, UA: NEGATIVE mg/dL
Hgb urine dipstick: NEGATIVE
Ketones, ur: 5 mg/dL — AB
Leukocytes, UA: NEGATIVE
Nitrite: NEGATIVE
Protein, ur: NEGATIVE mg/dL
Specific Gravity, Urine: 1.023 (ref 1.005–1.030)
pH: 5 (ref 5.0–8.0)

## 2018-05-15 MED ORDER — AZITHROMYCIN 250 MG PO TABS
1000.0000 mg | ORAL_TABLET | Freq: Once | ORAL | Status: AC
  Administered 2018-05-15: 1000 mg via ORAL
  Filled 2018-05-15: qty 4

## 2018-05-15 MED ORDER — ALBUTEROL SULFATE HFA 108 (90 BASE) MCG/ACT IN AERS
2.0000 | INHALATION_SPRAY | Freq: Once | RESPIRATORY_TRACT | Status: AC
  Administered 2018-05-15: 2 via RESPIRATORY_TRACT
  Filled 2018-05-15: qty 6.7

## 2018-05-15 MED ORDER — STERILE WATER FOR INJECTION IJ SOLN
INTRAMUSCULAR | Status: AC
  Administered 2018-05-15: 10 mL
  Filled 2018-05-15: qty 10

## 2018-05-15 MED ORDER — CEFTRIAXONE SODIUM 250 MG IJ SOLR
250.0000 mg | Freq: Once | INTRAMUSCULAR | Status: AC
  Administered 2018-05-15: 250 mg via INTRAMUSCULAR
  Filled 2018-05-15: qty 250

## 2018-05-15 MED ORDER — AEROCHAMBER PLUS FLO-VU LARGE MISC
Status: AC
  Administered 2018-05-15: 23:00:00
  Filled 2018-05-15: qty 1

## 2018-05-15 NOTE — Discharge Instructions (Signed)
Treated today for gonorrhea and chlamydia and have STD testing pending.  He will be contacted by phone in 2 to 3 days if any results are positive, at which time you should notify any partners.  Please make sure you are using protection.  In the future you can follow-up at the health department for future STD testing needs.  Use asthma inhaler as needed for wheezing.  If you develop worsening shortness of breath, chest pain, cough or any other new or concerning symptoms return for further evaluation or follow-up with your primary care doctor.

## 2018-05-15 NOTE — ED Triage Notes (Signed)
Pt states that his partner told him she had chlamydia and he wants to be checked, deneis symptoms, pt also requesting a refill for his inhaler while he is here.

## 2018-05-15 NOTE — ED Notes (Signed)
Pt complains of SOB and STD exposure. Pt states he has asthma and needs an inhaler.

## 2018-05-15 NOTE — ED Provider Notes (Signed)
[facility] Provider Note   CSN: [id number] Arrival date & time: 05/15/18  2152     History   Chief Complaint Chief Complaint  Patient presents with  . Exposure to STD    HPI Douglas Lozano is a 27 y.o. male.  Douglas Lozano is a 27 y.o. Male with history of asthma, presents to the emergency department for STD exposure.  Patient reports his partner told him that she tested positive for chlamydia and he would like to be checked.  He denies any penile discharge, dysuria or urinary frequency.  No genital lesions noted.  No abdominal pain, penile pain, testicular pain or swelling.  No pain with defecation.  No fevers or chills.  No rashes noted.  Patient does report that he has history of asthma and has had some wheezing recently, no cough, shortness of breath, chest pain or fevers.  Patient is out of his inhaler and requesting a refill.     Past Medical History:  Diagnosis Date  . Asthma     There are no active problems to display for this patient.   History reviewed. No pertinent surgical history.      Home Medications    Prior to Admission medications   Medication Sig Start Date End Date Taking? Authorizing Provider  albuterol (PROVENTIL HFA;VENTOLIN HFA) 108 (90 Base) MCG/ACT inhaler Inhale 1-2 puffs into the lungs every 6 (six) hours as needed for wheezing or shortness of breath. 12/12/16   Multani, Bhupinder, NP  amoxicillin-clavulanate (AUGMENTIN) 875-125 MG tablet Take 1 tablet by mouth 2 (two) times daily. 05/13/17   Caccavale, Sophia, PA-C  benzonatate (TESSALON) 100 MG capsule Take 1 capsule (100 mg total) by mouth every 8 (eight) hours. 12/12/16   Multani, Bhupinder, NP  methocarbamol (ROBAXIN) 500 MG tablet Take 1 tablet (500 mg total) by mouth 2 (two) times daily. 08/13/17   Garlon HatchetSanders, Lisa M, PA-C  naproxen (NAPROSYN) 500 MG tablet Take 1 tablet (500 mg total) by mouth 2 (two) times daily with a meal. 08/13/17   Garlon HatchetSanders, Lisa M,  PA-C  predniSONE (DELTASONE) 10 MG tablet Take 2 tablets (20 mg total) by mouth daily with breakfast. 12/12/16   Multani, Bhupinder, NP    Family History No family history on file.  Social History Social History   Tobacco Use  . Smoking status: Never Smoker  . Smokeless tobacco: Never Used  Substance Use Topics  . Alcohol use: Yes    Comment: occasional  . Drug use: Yes    Types: Marijuana     Allergies   Patient has no known allergies.   Review of Systems Review of Systems  Constitutional: Negative for chills and fever.  HENT: Negative.   Respiratory: Positive for wheezing. Negative for cough, chest tightness and shortness of breath.   Cardiovascular: Negative for chest pain.  Gastrointestinal: Negative for abdominal pain, nausea, rectal pain and vomiting.  Genitourinary: Negative for discharge, dysuria, frequency, genital sores, penile pain, penile swelling, scrotal swelling and testicular pain.  Skin: Negative for rash.     Physical Exam Updated Vital Signs BP 138/87 (BP Location: Right Arm)   Pulse (!) 59   Temp 98.5 F (36.9 C) (Oral)   Resp 18   SpO2 97%   Physical Exam  Constitutional: He appears well-developed and well-nourished. No distress.  HENT:  Head: Normocephalic and atraumatic.  Eyes: Right eye exhibits no discharge. Left eye exhibits no discharge.  Cardiovascular: Normal rate, regular rhythm, normal heart sounds and  intact distal pulses.  Pulmonary/Chest: Effort normal. No respiratory distress.  Respirations equal and unlabored, patient able to speak in full sentences, few faint expiratory wheezes heard throughout, patient moving good air  Abdominal: Soft. Bowel sounds are normal. He exhibits no distension and no mass. There is no tenderness. There is no guarding.  Abdomen soft, nondistended, nontender to palpation in all quadrants without guarding or peritoneal signs  Genitourinary:  Genitourinary Comments: Chaperone present during genital  exam. No inguinal lymphadenopathy noted, no external genital lesions.  No penile discharge noted, no penile swelling or tenderness, no scrotal swelling or tenderness.  Neurological: He is alert. Coordination normal.  Skin: Skin is warm and dry. Capillary refill takes less than 2 seconds. He is not diaphoretic.  Psychiatric: He has a normal mood and affect. His behavior is normal.  Nursing note and vitals reviewed.    ED Treatments / Results  Labs (all labs ordered are listed, but only abnormal results are displayed) Labs Reviewed  URINALYSIS, ROUTINE W REFLEX MICROSCOPIC - Abnormal; Notable for the following components:      Result Value   Ketones, ur 5 (*)    All other components within normal limits  RPR  HIV ANTIBODY (ROUTINE TESTING)  GC/CHLAMYDIA PROBE AMP ([place]) NOT AT [facility]    EKG None  Radiology No results found.  Procedures Procedures (including critical care time)  Medications Ordered in ED Medications  cefTRIAXone (ROCEPHIN) injection 250 mg (250 mg Intramuscular Given 05/15/18 2254)  azithromycin (ZITHROMAX) tablet 1,000 mg (1,000 mg Oral Given 05/15/18 2246)  albuterol (PROVENTIL HFA;VENTOLIN HFA) 108 (90 Base) MCG/ACT inhaler 2 puff (2 puffs Inhalation Given 05/15/18 2247)  AEROCHAMBER PLUS FLO-VU LARGE MISC (  Given 05/15/18 2247)  sterile water (preservative free) injection (10 mLs  Given 05/15/18 2255)     Initial Impression / Assessment and Plan / ED Course  I have reviewed the triage vital signs and the nursing notes.  Pertinent labs & imaging results that were available during my care of the patient were reviewed by me and considered in my medical decision making (see chart for details).  Patient is afebrile without abdominal tenderness, abdominal pain or painful bowel movements to indicate prostatitis.  No tenderness to palpation of the testes or epididymis to suggest orchitis or epididymitis.  STD cultures obtained including HIV, syphilis,  gonorrhea and chlamydia. Patient to be discharged with instructions to follow up with PCP. Discussed importance of using protection when sexually active. Pt understands that they have GC/Chlamydia cultures pending and that they will need to inform all sexual partners if results return positive. Patient has been treated prophylactically with azithromycin and Rocephin.    Patient also reports he is run out of his asthma inhaler and has had some intermittent wheezing.  No shortness of breath or coughing, no increased work of breathing.  Normal vitals here in the ED lungs with intermittent faint expiratory wheezes.  Provided with albuterol inhaler here in the emergency department.  No concern for acute exacerbation or need for steroids.  Return precautions discussed.   Final Clinical Impressions(s) / ED Diagnoses   Final diagnoses:  STD exposure  Asthma, unspecified asthma severity, unspecified whether complicated, unspecified whether persistent    ED Discharge Orders    None       Legrand Rams 05/15/18 2354    Charlynne Pander, MD 05/18/18 2216

## 2018-05-16 LAB — RPR: RPR Ser Ql: NONREACTIVE

## 2018-05-16 LAB — HIV ANTIBODY (ROUTINE TESTING W REFLEX): HIV Screen 4th Generation wRfx: NONREACTIVE

## 2018-05-17 LAB — GC/CHLAMYDIA PROBE AMP (~~LOC~~) NOT AT ARMC
Chlamydia: NEGATIVE
Neisseria Gonorrhea: NEGATIVE
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
